# Patient Record
Sex: Male | Born: 1955 | Race: White | Hispanic: No | Marital: Married | Smoking: Former smoker
Health system: Southern US, Community
[De-identification: ages and names within clinical notes are randomized; demographics above are authoritative.]

## PROBLEM LIST (undated history)

## (undated) DIAGNOSIS — R569 Unspecified convulsions: Secondary | ICD-10-CM

## (undated) DIAGNOSIS — F0280 Dementia in other diseases classified elsewhere without behavioral disturbance: Secondary | ICD-10-CM

## (undated) DIAGNOSIS — R1310 Dysphagia, unspecified: Secondary | ICD-10-CM

## (undated) DIAGNOSIS — G4730 Sleep apnea, unspecified: Secondary | ICD-10-CM

## (undated) DIAGNOSIS — F02818 Dementia in other diseases classified elsewhere, unspecified severity, with other behavioral disturbance: Secondary | ICD-10-CM

## (undated) DIAGNOSIS — G309 Alzheimer's disease, unspecified: Secondary | ICD-10-CM

## (undated) DIAGNOSIS — R251 Tremor, unspecified: Secondary | ICD-10-CM

## (undated) DIAGNOSIS — F0281 Dementia in other diseases classified elsewhere with behavioral disturbance: Secondary | ICD-10-CM

## (undated) DIAGNOSIS — F419 Anxiety disorder, unspecified: Secondary | ICD-10-CM

## (undated) DIAGNOSIS — R296 Repeated falls: Secondary | ICD-10-CM

## (undated) DIAGNOSIS — R451 Restlessness and agitation: Secondary | ICD-10-CM

## (undated) DIAGNOSIS — E7800 Pure hypercholesterolemia, unspecified: Secondary | ICD-10-CM

## (undated) HISTORY — PX: OTHER SURGICAL HISTORY: SHX169

---

## 2013-07-06 ENCOUNTER — Ambulatory Visit: Payer: Self-pay | Admitting: Neurology

## 2015-03-22 ENCOUNTER — Emergency Department
Admission: EM | Admit: 2015-03-22 | Discharge: 2015-03-23 | Disposition: A | Discharge status: Home / Self Care | Payer: Medicare HMO | Attending: Emergency Medicine | Admitting: Emergency Medicine

## 2015-03-22 ENCOUNTER — Encounter: Payer: Self-pay | Admitting: Emergency Medicine

## 2015-03-22 DIAGNOSIS — Z7982 Long term (current) use of aspirin: Secondary | ICD-10-CM | POA: Insufficient documentation

## 2015-03-22 DIAGNOSIS — G309 Alzheimer's disease, unspecified: Secondary | ICD-10-CM | POA: Diagnosis not present

## 2015-03-22 DIAGNOSIS — F0390 Unspecified dementia without behavioral disturbance: Secondary | ICD-10-CM

## 2015-03-22 DIAGNOSIS — F0280 Dementia in other diseases classified elsewhere without behavioral disturbance: Secondary | ICD-10-CM | POA: Diagnosis not present

## 2015-03-22 DIAGNOSIS — Z880 Allergy status to penicillin: Secondary | ICD-10-CM | POA: Diagnosis not present

## 2015-03-22 DIAGNOSIS — Z87891 Personal history of nicotine dependence: Secondary | ICD-10-CM | POA: Insufficient documentation

## 2015-03-22 DIAGNOSIS — R451 Restlessness and agitation: Secondary | ICD-10-CM | POA: Diagnosis not present

## 2015-03-22 DIAGNOSIS — Z792 Long term (current) use of antibiotics: Secondary | ICD-10-CM | POA: Insufficient documentation

## 2015-03-22 DIAGNOSIS — G4733 Obstructive sleep apnea (adult) (pediatric): Secondary | ICD-10-CM | POA: Diagnosis not present

## 2015-03-22 DIAGNOSIS — Z79899 Other long term (current) drug therapy: Secondary | ICD-10-CM | POA: Diagnosis not present

## 2015-03-22 DIAGNOSIS — G478 Other sleep disorders: Secondary | ICD-10-CM | POA: Diagnosis present

## 2015-03-22 HISTORY — DX: Dysphagia, unspecified: R13.10

## 2015-03-22 HISTORY — DX: Restlessness and agitation: R45.1

## 2015-03-22 HISTORY — DX: Dementia in other diseases classified elsewhere, unspecified severity, without behavioral disturbance, psychotic disturbance, mood disturbance, and anxiety: F02.80

## 2015-03-22 HISTORY — DX: Dementia in other diseases classified elsewhere, unspecified severity, with other behavioral disturbance: F02.818

## 2015-03-22 HISTORY — DX: Tremor, unspecified: R25.1

## 2015-03-22 HISTORY — DX: Pure hypercholesterolemia, unspecified: E78.00

## 2015-03-22 HISTORY — DX: Anxiety disorder, unspecified: F41.9

## 2015-03-22 HISTORY — DX: Unspecified convulsions: R56.9

## 2015-03-22 HISTORY — DX: Sleep apnea, unspecified: G47.30

## 2015-03-22 HISTORY — DX: Repeated falls: R29.6

## 2015-03-22 HISTORY — DX: Alzheimer's disease, unspecified: G30.9

## 2015-03-22 HISTORY — DX: Dementia in other diseases classified elsewhere with behavioral disturbance: F02.81

## 2015-03-22 LAB — CBC WITH DIFFERENTIAL/PLATELET
BASOS ABS: 0.2 10*3/uL — AB (ref 0–0.1)
BASOS PCT: 2 %
EOS PCT: 3 %
Eosinophils Absolute: 0.3 10*3/uL (ref 0–0.7)
HCT: 42.8 % (ref 40.0–52.0)
Hemoglobin: 14.3 g/dL (ref 13.0–18.0)
Lymphocytes Relative: 28 %
Lymphs Abs: 2.3 10*3/uL (ref 1.0–3.6)
MCH: 29 pg (ref 26.0–34.0)
MCHC: 33.4 g/dL (ref 32.0–36.0)
MCV: 86.7 fL (ref 80.0–100.0)
MONO ABS: 0.5 10*3/uL (ref 0.2–1.0)
Monocytes Relative: 6 %
Neutro Abs: 5.2 10*3/uL (ref 1.4–6.5)
Neutrophils Relative %: 61 %
PLATELETS: 322 10*3/uL (ref 150–440)
RBC: 4.93 MIL/uL (ref 4.40–5.90)
RDW: 14.3 % (ref 11.5–14.5)
WBC: 8.5 10*3/uL (ref 3.8–10.6)

## 2015-03-22 LAB — COMPREHENSIVE METABOLIC PANEL
ALBUMIN: 3.8 g/dL (ref 3.5–5.0)
ALT: 15 U/L — ABNORMAL LOW (ref 17–63)
AST: 17 U/L (ref 15–41)
Alkaline Phosphatase: 87 U/L (ref 38–126)
Anion gap: 10 (ref 5–15)
BUN: 13 mg/dL (ref 6–20)
CHLORIDE: 106 mmol/L (ref 101–111)
CO2: 28 mmol/L (ref 22–32)
Calcium: 9.7 mg/dL (ref 8.9–10.3)
Creatinine, Ser: 0.86 mg/dL (ref 0.61–1.24)
GFR calc Af Amer: >60 mL/min (ref >60)
GFR calc non Af Amer: >60 mL/min (ref >60)
GLUCOSE: 88 mg/dL (ref 65–99)
POTASSIUM: 3.9 mmol/L (ref 3.5–5.1)
SODIUM: 144 mmol/L (ref 135–145)
TOTAL PROTEIN: 7.7 g/dL (ref 6.5–8.1)
Total Bilirubin: 0.6 mg/dL (ref 0.3–1.2)

## 2015-03-22 NOTE — ED Provider Notes (Signed)
[facility] Emergency Department Provider Note  ____________________________________________  Time seen: 2230  I have reviewed the triage vital signs and the nursing notes.  History by:  Patient's wife.  History Limited due to the patient's dementia and nonverbal status.  HISTORY  Chief Complaint Questionable seizure   sonorous breathing while sleeping  Decreased responsiveness compared to baseline.    HPI Joel Frost is a 60 y.o. male who has had early onset dementia. He stays at [facility] due to his need for additional care. He was recently moved there from [place].   This evening, the staff noted that he was having noisier breathing in his sleep and usual. His wife tells me that he does have a history of objective sleep apnea. He has never been able to tolerate a pressure mask for that condition.  With the noisy breathing, the staff attempted to awaken the patient. He did not respond in his usual manner. They report he appeared somewhat gray.EMS was called. While EMS was present and subsequently department facility with the patient, the nursing staff at [facility] reports that the patient had improved to baseline.   The staff at called the patient's wife. The wife met the patient here in the emergency department. She reports he was not acting his usual manner at first but he is now.  He had a similar episode approximately 2 months ago while staying in [place]. At that time, he was considered to be a possible seizure. No seizure activity was noted this evening.     Past Medical History  Diagnosis Date  . Alzheimer disease   . Dementia associated with other underlying disease with behavioral disturbance   . Dysphagia   . Repeated falls   . Tremor   . Anxiety   . Restlessness and agitation   . Hypercholesteremia   . Seizures ([place])     There are no active problems to display for this patient.   Past Surgical  History  Procedure Laterality Date  . Ruptured disc      Current Outpatient Rx  Name  Route  Sig  Dispense  Refill  . aspirin 81 MG chewable tablet   Oral   Chew 81 mg by mouth daily.         . clindamycin (CLEOCIN) 150 MG capsule   Oral   Take 450 mg by mouth 3 (three) times daily.         . memantine (NAMENDA XR) 14 MG CP24 24 hr capsule   Oral   Take 14 mg by mouth daily.         . multivitamin-iron-minerals-folic acid (CENTRUM) chewable tablet   Oral   Chew 1 tablet by mouth daily.         Marland Kitchen oxyCODONE-acetaminophen (PERCOCET/ROXICET) 5-325 MG tablet   Oral   Take 1 tablet by mouth every 4 (four) hours as needed for moderate pain.         Marland Kitchen QUEtiapine (SEROQUEL) 200 MG tablet   Oral   Take 200 mg by mouth 3 (three) times daily.         . rivastigmine (EXELON) 9.5 mg/24hr   Transdermal   Place 9.5 mg onto the skin daily.           Allergies Penicillins and Sulfa antibiotics  No family history on file.  Social History Social History  Substance Use Topics  . Smoking status: Former Research scientist (life sciences)  . Smokeless tobacco: None  . Alcohol Use: No  Review of Systems Review of systems not possible due to the patient's dementia and nonverbal status.   ____________________________________________   PHYSICAL EXAM:  VITAL SIGNS: ED Triage Vitals  Enc Vitals Group     BP 03/22/15 2216 119/94 mmHg     Pulse Rate 03/22/15 2216 96     Resp 03/22/15 2216 18     Temp 03/22/15 2216 97.9 F (36.6 C)     Temp Source 03/22/15 2216 Axillary     SpO2 03/22/15 2200 97 %     Weight 03/22/15 2216 223 lb (101.152 kg)     Height 03/22/15 2216 6' 1"  (1.854 m)     Head Cir --      Peak Flow --      Pain Score --      Pain Loc --      Pain Edu? --      Excl. in [place]? --     Constitutional: Alert, normal-appearing color. Slight agitation as the nurses are trying to draw blood when I first entered the room. Patient's wife reports he is acting in his usual  manner. ENT   Head: Normocephalic and atraumatic.   Nose: No congestion/rhinnorhea.       Mouth: No erythema, no swelling. The hard palate is narrow and deep.   Cardiovascular: Normal rate at 96, regular rhythm, no murmur noted Respiratory:  Normal respiratory effort, no tachypnea.    Breath sounds are clear and equal bilaterally.  Gastrointestinal: Soft, no distention. Nontender Musculoskeletal: No deformity noted. Nontender with normal range of motion in all extremities.  No noted edema. Neurologic:  Patient is alert. He is somewhat agitated. He moves all 4 extremities. He is nonverbal and has cognition limitations. No focal changes noted.  Skin:  Skin is warm, dry. No rash noted. Psychiatric: Agitated. Cognitive limitations. Nonverbal. ____________________________________________    LABS (pertinent positives/negatives)  Labs Reviewed  CBC WITH DIFFERENTIAL/PLATELET - Abnormal; Notable for the following:    Basophils Absolute 0.2 (*)    All other components within normal limits  COMPREHENSIVE METABOLIC PANEL - Abnormal; Notable for the following:    ALT 15 (*)    All other components within normal limits     ____________________________________________   EKG  ED ECG REPORT I, Hayleen Clinkscales W, the attending physician, personally viewed and interpreted this ECG.   Date: 03/22/2015  EKG Time: 2238  Rate: 94  Rhythm: sinus rhythm  Axis: Normal  Intervals: Normal  ST&T Change: None noted   ____________________________________________    RADIOLOGY    ____________________________________________   PROCEDURES    ____________________________________________   INITIAL IMPRESSION / ASSESSMENT AND PLAN / ED COURSE  Pertinent labs & imaging results that were available during my care of the patient were reviewed by me and considered in my medical decision making (see chart for details).  Unclear clinical situation in a 60 year old male with early dementia.  He is back to his baseline currently.   It does sound as though he may have had increased degree of obstructive sleep apnea tonight, with sonorous breathing, subsequent gray color, decreased responsiveness, but now has improved to baseline.  I do not see any acute changes to his oropharynx. I don't think he has any ongoing obstruction. He is does not have any stridor.  We will observe the patient emergency department, checked basic labs, and if labs are negative, will discharge him back to the care facility.  The wife is in agreement with this plan.  ----------------------------------------- 11:26 PM on  03/22/2015 -----------------------------------------  Blood count and basic metabolic panel are within normal limits. Patient is acting at his baseline. He does not have any hypoxia. Heart rate and blood pressure reasonable. There is no fever. We will discharge the patient to return to the [facility].  ____________________________________________   FINAL CLINICAL IMPRESSION(S) / ED DIAGNOSES  Final diagnoses:  Obstructive sleep apnea  Dementia, without behavioral disturbance      Ahmed Prima, MD 03/22/15 2327

## 2015-03-22 NOTE — Discharge Instructions (Signed)
It seems the episode tonight was likely related to obstructive sleep apnea. Further evaluation may be needed. Speak with your regular physician about this. If there are further worrisome events or episodes where she have other urgent concerns, return to the emergency department.  Sleep Apnea Sleep apnea is disorder that affects a person's sleep. A person with sleep apnea has abnormal pauses in their breathing when they sleep. It is hard for them to get a good sleep. This makes a person tired during the day. It also can lead to other physical problems. There are three types of sleep apnea. One type is when breathing stops for a short time because your airway is blocked (obstructive sleep apnea). Another type is when the brain sometimes fails to give the normal signal to breathe to the muscles that control your breathing (central sleep apnea). The third type is a combination of the other two types. HOME CARE  Do not sleep on your back. Try to sleep on your side.  Take all medicine as told by your doctor.  Avoid alcohol, calming medicines (sedatives), and depressant drugs.  Try to lose weight if you are overweight. Talk to your doctor about a healthy weight goal. Your doctor may have you use a device that helps to open your airway. It can help you get the air that you need. It is called a positive airway pressure (PAP) device. There are three types of PAP devices:  Continuous positive airway pressure (CPAP) device.  Nasal expiratory positive airway pressure (EPAP) device.  Bilevel positive airway pressure (BPAP) device. MAKE SURE YOU:  Understand these instructions.  Will watch your condition.  Will get help right away if you are not doing well or get worse.   This information is not intended to replace advice given to you by your health care provider. Make sure you discuss any questions you have with your health care provider.   Document Released: 11/29/2007 Document Revised: 03/12/2014  Document Reviewed: 06/23/2011 Elsevier Interactive Patient Education [employer]

## 2015-03-22 NOTE — ED Notes (Signed)
Pt presents to ED via [facility] from [facility], EMS was called out for "snoring respirations." EMS states facility reported pt normally "sleeps silent and does not snore" EMS reports pt was gray in color upon their arrival and pt was unresponsive to stimuli. EMS states when they were leaving facility and staff nurse came out, nurse responded "he looks like his normal self now." Pt was awake. Per wife, pt had a similar episode x 2 months ago at [facility] ans was diagnosed with seizure. Pt alert, verbally responding at baseline.

## 2015-03-23 NOTE — ED Notes (Signed)

## 2015-03-23 NOTE — ED Notes (Signed)
Spoke with Tia, RN from [facility], discharge instructions given to facility nurse.

## 2015-08-22 ENCOUNTER — Emergency Department: Payer: Medicare HMO

## 2015-08-22 ENCOUNTER — Encounter: Payer: Self-pay | Admitting: Emergency Medicine

## 2015-08-22 ENCOUNTER — Other Ambulatory Visit: Payer: Self-pay

## 2015-08-22 ENCOUNTER — Emergency Department
Admission: EM | Admit: 2015-08-22 | Discharge: 2015-08-22 | Disposition: A | Discharge status: Home / Self Care | Payer: Medicare HMO | Attending: Emergency Medicine | Admitting: Emergency Medicine

## 2015-08-22 DIAGNOSIS — F0280 Dementia in other diseases classified elsewhere without behavioral disturbance: Secondary | ICD-10-CM

## 2015-08-22 DIAGNOSIS — F0281 Dementia in other diseases classified elsewhere with behavioral disturbance: Secondary | ICD-10-CM | POA: Insufficient documentation

## 2015-08-22 DIAGNOSIS — G40909 Epilepsy, unspecified, not intractable, without status epilepticus: Secondary | ICD-10-CM | POA: Diagnosis not present

## 2015-08-22 DIAGNOSIS — Z87891 Personal history of nicotine dependence: Secondary | ICD-10-CM | POA: Insufficient documentation

## 2015-08-22 DIAGNOSIS — Z79899 Other long term (current) drug therapy: Secondary | ICD-10-CM | POA: Diagnosis not present

## 2015-08-22 DIAGNOSIS — G309 Alzheimer's disease, unspecified: Secondary | ICD-10-CM | POA: Diagnosis not present

## 2015-08-22 DIAGNOSIS — Z7982 Long term (current) use of aspirin: Secondary | ICD-10-CM | POA: Diagnosis not present

## 2015-08-22 DIAGNOSIS — R569 Unspecified convulsions: Secondary | ICD-10-CM | POA: Diagnosis present

## 2015-08-22 LAB — CBC WITH DIFFERENTIAL/PLATELET
BASOS ABS: 0.1 10*3/uL (ref 0–0.1)
BASOS PCT: 2 %
Eosinophils Absolute: 0.2 10*3/uL (ref 0–0.7)
Eosinophils Relative: 2 %
HEMATOCRIT: 43 % (ref 40.0–52.0)
HEMOGLOBIN: 14.7 g/dL (ref 13.0–18.0)
Lymphocytes Relative: 20 %
Lymphs Abs: 1.7 10*3/uL (ref 1.0–3.6)
MCH: 29.6 pg (ref 26.0–34.0)
MCHC: 34.2 g/dL (ref 32.0–36.0)
MCV: 86.6 fL (ref 80.0–100.0)
MONOS PCT: 7 %
Monocytes Absolute: 0.6 10*3/uL (ref 0.2–1.0)
NEUTROS ABS: 5.7 10*3/uL (ref 1.4–6.5)
NEUTROS PCT: 69 %
Platelets: 325 10*3/uL (ref 150–440)
RBC: 4.97 MIL/uL (ref 4.40–5.90)
RDW: 14 % (ref 11.5–14.5)
WBC: 8.3 10*3/uL (ref 3.8–10.6)

## 2015-08-22 LAB — URINALYSIS COMPLETE WITH MICROSCOPIC (ARMC ONLY)
BACTERIA UA: NONE SEEN
Bilirubin Urine: NEGATIVE
Glucose, UA: NEGATIVE mg/dL
HGB URINE DIPSTICK: NEGATIVE
LEUKOCYTES UA: NEGATIVE
NITRITE: NEGATIVE
PH: 6 (ref 5.0–8.0)
PROTEIN: 100 mg/dL — AB
SPECIFIC GRAVITY, URINE: 1.017 (ref 1.005–1.030)

## 2015-08-22 LAB — BASIC METABOLIC PANEL
ANION GAP: 9 (ref 5–15)
BUN: 15 mg/dL (ref 6–20)
CALCIUM: 9.6 mg/dL (ref 8.9–10.3)
CO2: 27 mmol/L (ref 22–32)
Chloride: 106 mmol/L (ref 101–111)
Creatinine, Ser: 0.87 mg/dL (ref 0.61–1.24)
GFR calc non Af Amer: >60 mL/min (ref >60)
Glucose, Bld: 86 mg/dL (ref 65–99)
Potassium: 4.4 mmol/L (ref 3.5–5.1)
Sodium: 142 mmol/L (ref 135–145)

## 2015-08-22 MED ORDER — SODIUM CHLORIDE 0.9 % IV BOLUS (SEPSIS)
1000.0000 mL | Freq: Once | INTRAVENOUS | Status: AC
  Administered 2015-08-22: 1000 mL via INTRAVENOUS

## 2015-08-22 NOTE — ED Notes (Signed)
[facility] April contacted with report, states unable to provide transport. Will arrange EMS transport.

## 2015-08-22 NOTE — ED Provider Notes (Signed)
[facility] Emergency Department Provider Note  ____________________________________________  Time seen: 9:10 AM  I have reviewed the triage vital signs and the nursing notes.   HISTORY  Chief Complaint Seizures Seizure Level 5 caveat:  Portions of the history and physical were unable to be obtained due to the patient's altered mental status  HPI Joel Frost is a 60 y.o. male sent [facility] the [facility] from [facility] due to a seizure today. He has a history of seizures as well as Alzheimer's disease, dementia, being nonverbal at baseline. This morning he was in his usual state of health when he became unresponsive and had generalized shaking activity for about 10 minutes. This resolved spontaneously. No other acute symptoms reported. Patient code status is DO NOT RESUSCITATE.     Past Medical History  Diagnosis Date  . Alzheimer disease   . Dementia associated with other underlying disease with behavioral disturbance   . Dysphagia   . Repeated falls   . Tremor   . Anxiety   . Restlessness and agitation   . Hypercholesteremia   . Seizures ([facility])   . Sleep apnea      There are no active problems to display for this patient.    Past Surgical History  Procedure Laterality Date  . Ruptured disc       Current Outpatient Rx  Name  Route  Sig  Dispense  Refill  . aspirin 81 MG chewable tablet   Oral   Chew 81 mg by mouth daily.         . memantine (NAMENDA XR) 14 MG CP24 24 hr capsule   Oral   Take 14 mg by mouth daily.         . multivitamin-iron-minerals-folic acid (CENTRUM) chewable tablet   Oral   Chew 1 tablet by mouth daily.         Marland Kitchen. oxyCODONE-acetaminophen (PERCOCET/ROXICET) 5-325 MG tablet   Oral   Take 1 tablet by mouth every 4 (four) hours as needed for moderate pain.         Marland Kitchen. QUEtiapine (SEROQUEL) 200 MG tablet   Oral   Take 200 mg by mouth 3 (three) times daily.         . rivastigmine (EXELON) 9.5 mg/24hr  Transdermal   Place 9.5 mg onto the skin daily.            Allergies Penicillins; Sulfa antibiotics; and Terramycin   No family history on file.  Social History Social History  Substance Use Topics  . Smoking status: Former Games developermoker  . Smokeless tobacco: None  . Alcohol Use: No    Review of Systems Unable to obtain ____________________________________________   PHYSICAL EXAM:  VITAL SIGNS: ED Triage Vitals  Enc Vitals Group     BP --      Pulse --      Resp --      Temp --      Temp src --      SpO2 --      Weight --      Height --      Head Cir --      Peak Flow --      Pain Score --      Pain Loc --      Pain Edu? --      Excl. in [place]? --     Vital signs reviewed, nursing assessments reviewed.   Constitutional:  Awake, not interactive, not in apparent distress. Eyes:  No scleral icterus. No conjunctival pallor. PERRL. EOMI.  No nystagmus. ENT   Head:   Normocephalic and atraumatic.   Nose:   No congestion/rhinnorhea. No septal hematoma   Mouth/Throat:   Dry mucous membranes, no pharyngeal erythema. No peritonsillar mass.    Neck:   No stridor. No SubQ emphysema. No meningismus. Hematological/Lymphatic/Immunilogical:   No cervical lymphadenopathy. Cardiovascular:   RRR. Symmetric bilateral radial and DP pulses.  No murmurs.  Respiratory:   Normal respiratory effort without tachypnea nor retractions. Breath sounds are clear and equal bilaterally. No wheezes/rales/rhonchi. Gastrointestinal:   Soft and nontender. Non distended. There is no CVA tenderness.  No rebound, rigidity, or guarding. Genitourinary:   deferred Musculoskeletal:   Nontender with normal range of motion in all extremities. No joint effusions.  No lower extremity tenderness.  No edema. Neurologic:   Nonverbal.  Neurologic examination limited by patient's inability to participate in exam. No gross focal neurologic deficits are appreciated.  Skin:    Skin is warm, dry and  intact. No rash noted.  No petechiae, purpura, or bullae.  ____________________________________________    LABS (pertinent positives/negatives) (all labs ordered are listed, but only abnormal results are displayed) Labs Reviewed  URINALYSIS COMPLETEWITH MICROSCOPIC ([facility] ONLY) - Abnormal; Notable for the following:    Color, Urine YELLOW (*)    APPearance CLEAR (*)    Ketones, ur TRACE (*)    Protein, ur 100 (*)    Squamous Epithelial / LPF 0-5 (*)    All other components within normal limits  BASIC METABOLIC PANEL  CBC WITH DIFFERENTIAL/PLATELET  CBG MONITORING, ED   ____________________________________________   EKG  Interpreted by me Normal sinus rhythm rate of 90, normal axis and intervals. Normal QRS ST segments and T waves.  ____________________________________________    RADIOLOGY  CT head unremarkable Chest x-ray unremarkable  ____________________________________________   PROCEDURES   ____________________________________________   INITIAL IMPRESSION / ASSESSMENT AND PLAN / ED COURSE  Pertinent labs & imaging results that were available during my care of the patient were reviewed by me and considered in my medical decision making (see chart for details).  Patient with seizure disorder but also underlying dementia and nonverbal state presents with seizure and diminished interactiveness. Due to limitations of history and physical, we'll proceed with labs chest x-ray urinalysis CT head.  ----------------------------------------- 10:16 AM on 08/22/2015 -----------------------------------------  Vital signs normal, workup negative. Plan to discharge home, follow-up with primary care and neurology follow-up. The patient remains awake and stable in the ED.       ____________________________________________   FINAL CLINICAL IMPRESSION(S) / ED DIAGNOSES  Final diagnoses:  Seizure ([facility])  Alzheimer's dementia       Portions of this note were  generated with dragon dictation software. Dictation errors may occur despite best attempts at proofreading.   Sharman Cheek, MD 08/22/15 [phone]

## 2015-08-22 NOTE — ED Notes (Signed)
Possible seizure at nursing home described as eyes rolling back and shaking approx 10 min. Arrives awake, nonverbal per baseline per report. Noted to [facility].

## 2015-08-22 NOTE — ED Notes (Signed)
Patient discharged by ambulance to [facility]. Wife will meet him there.

## 2015-08-22 NOTE — Discharge Instructions (Signed)
Epilepsy Epilepsy is a disorder in which a person has repeated seizures over time. A seizure is a release of abnormal electrical activity in the brain. Seizures can cause a change in attention, behavior, or the ability to remain awake and alert (altered mental status). Seizures often involve uncontrollable shaking (convulsions).  Most people with epilepsy lead normal lives. However, people with epilepsy are at an increased risk of falls, accidents, and injuries. Therefore, it is important to begin treatment right away. CAUSES  Epilepsy has many possible causes. Anything that disturbs the normal pattern of brain cell activity can lead to seizures. This may include:   Head injury.  Birth trauma.  High fever as a child.  Stroke.  Bleeding into or around the brain.  Certain drugs.  Prolonged low oxygen, such as what occurs after CPR efforts.  Abnormal brain development.  Certain illnesses, such as meningitis, encephalitis (brain infection), malaria, and other infections.  An imbalance of nerve signaling chemicals (neurotransmitters).  SIGNS AND SYMPTOMS  The symptoms of a seizure can vary greatly from one person to another. Right before a seizure, you may have a warning (aura) that a seizure is about to occur. An aura may include the following symptoms:  Fear or anxiety.  Nausea.  Feeling like the room is spinning (vertigo).  Vision changes, such as seeing flashing lights or spots. Common symptoms during a seizure include:  Abnormal sensations, such as an abnormal smell or a bitter taste in the mouth.   Sudden, general body stiffness.   Convulsions that involve rhythmic jerking of the face, arm, or leg on one or both sides.   Sudden change in consciousness.   Appearing to be awake but not responding.   Appearing to be asleep but cannot be awakened.   Grimacing, chewing, lip smacking, drooling, tongue biting, or loss of bowel or bladder control. After a seizure,  you may feel sleepy for a while. DIAGNOSIS  Your health care provider will ask about your symptoms and take a medical history. Descriptions from any witnesses to your seizures will be very helpful in the diagnosis. A physical exam, including a detailed neurological exam, is necessary. Various tests may be done, such as:   An electroencephalogram (EEG). This is a painless test of your brain waves. In this test, a diagram is created of your brain waves. These diagrams can be interpreted by a specialist.  An MRI of the brain.   A CT scan of the brain.   A spinal tap (lumbar puncture, LP).  Blood tests to check for signs of infection or abnormal blood chemistry. TREATMENT  There is no cure for epilepsy, but it is generally treatable. Once epilepsy is diagnosed, it is important to begin treatment as soon as possible. For most people with epilepsy, seizures can be controlled with medicines. The following may also be used:  A pacemaker for the brain (vagus nerve stimulator) can be used for people with seizures that are not well controlled by medicine.  Surgery on the brain. For some people, epilepsy eventually goes away. HOME CARE INSTRUCTIONS   Follow your health care provider's recommendations on driving and safety in normal activities.  Get enough rest. Lack of sleep can cause seizures.  Only take over-the-counter or prescription medicines as directed by your health care provider. Take any prescribed medicine exactly as directed.  Avoid any known triggers of your seizures.  Keep a seizure diary. Record what you recall about any seizure, especially any possible trigger.   Make  sure the people you live and work with know that you are prone to seizures. They should receive instructions on how to help you. In general, a witness to a seizure should:   Cushion your head and body.   Turn you on your side.   Avoid unnecessarily restraining you.   Not place anything inside your  mouth.   Call for emergency medical help if there is any question about what has occurred.   Follow up with your health care provider as directed. You may need regular blood tests to monitor the levels of your medicine.  SEEK MEDICAL CARE IF:   You develop signs of infection or other illness. This might increase the risk of a seizure.   You seem to be having more frequent seizures.   Your seizure pattern is changing.  SEEK IMMEDIATE MEDICAL CARE IF:   You have a seizure that does not stop after a few moments.   You have a seizure that causes any difficulty in breathing.   You have a seizure that results in a very severe headache.   You have a seizure that leaves you with the inability to speak or use a part of your body.    This information is not intended to replace advice given to you by your health care provider. Make sure you discuss any questions you have with your health care provider.   Document Released: 02/19/2005 Document Revised: 12/10/2012 Document Reviewed: 10/01/2012 Elsevier Interactive Patient Education 2016 [place] Disease Alzheimer disease is a mental disorder. It causes memory loss and loss of other mental functions, such as learning, thinking, problem solving, communicating, and completing tasks. The mental losses interfere with the ability to perform daily activities at work, at home, or in social situations. Alzheimer disease usually starts in a person's late 36s or early 51s but can start earlier in life (familial form). The mental changes caused by this disease are permanent and worsen over time. As the illness progresses, the ability to do even the simplest things is lost. Survival with Alzheimer disease ranges from several years to as long as 20 years. CAUSES Alzheimer disease is caused by abnormally high levels of a protein (beta-amyloid) in the brain. This protein forms very small deposits within and around the brain's nerve  cells. These deposits prevent the nerve cells from working properly. Experts are not certain what causes the beta-amyloid deposits in this disease. RISK FACTORS The following major risk factors have been identified:  Increasing age.  Certain genetic variations, such as Down syndrome (trisomy 21). SYMPTOMS In the early stages of Alzheimer disease, you are still able to perform daily activities but need greater effort, more time, or memory aids. Early symptoms include:  Mild memory loss of recent events, names, or phone numbers.  Loss of objects.  Minor loss of vocabulary.  Difficulty with complex tasks, such as paying bills or driving in unfamiliar locations. Other mental functions deteriorate as the disease worsens. These changes slowly go from mild to severe. Symptoms at this stage include:  Difficulty remembering. You may not be able to recall personal information such as your address and telephone number. You may become confused about the date, the season of the year, or your location.  Difficulty maintaining attention. You may forget what you wanted to say during conversations and repeat what you have already said.  Difficulty learning new information or tasks. You may not remember what you read or the name of a new friend you  met.  Difficulty counting or doing math. You may have difficulty with complex math problems. You may make mistakes in paying bills or managing your checkbook.  Poor reasoning and judgment. You may make poor decisions or not dress right for the weather.  Difficulty communicating. You may have regular difficulty remembering words, naming objects, expressing yourself clearly, or writing sentences that make sense.  Difficulty performing familiar daily activities. You may get lost driving in familiar locations or need help eating, bathing, dressing, grooming, or using the toilet. You may have difficulty maintaining bladder or bowel control.  Difficulty  recognizing familiar faces. You may confuse family members or close friends with one another. You may not recognize a close relative or may mistake strangers for family. Alzheimer disease also may cause changes in personality and behavior. These changes include:   Loss of interest or motivation.  Social withdrawal.  Anxiety.  Difficulty sleeping.  Uncharacteristic anger or combativeness.  A false belief that someone is trying to harm you (paranoia).  Seeing things that are not real (hallucinations).  Agitation. Confusion and disruptive behavior are often worse at night and may be triggered by changes in the environment or acute medical issues. DIAGNOSIS  Alzheimer disease is diagnosed through an assessment by your health care provider. During this assessment, your health care provider will do the following:  Ask you and your family, friends, or caregivers questions about your symptoms, their frequency, their duration and progression, and the effect they are having on your life.  Ask questions about your personal and family medical history and use of alcohol or drugs, including prescription medicine.  Perform a physical exam and order blood tests and brain imaging exams. Your health care provider may refer you to a specialist for detailed evaluation of your mental functions (neuropsychological testing).  Many different brain disorders, medical conditions, and certain substances can cause symptoms that resemble Alzheimer disease symptoms. These must be ruled out before this disease can be diagnosed. If Alzheimer disease is diagnosed, it will be considered either "possible" or "probable" Alzheimer disease. "Possible" Alzheimer disease means that your symptoms are typical of the disease and no other disorder is causing them. "Probable" Alzheimer disease means that you also have a family history of the disease or genetic test results that support the diagnosis. Certain tests, mostly used in  research studies, are highly specific for Alzheimer disease.  TREATMENT  There is currently no cure for this disease. The goals of treatment are to:  Slow down the progression of the disease.  Preserve mental function as long as possible.  Manage behavioral symptoms.  Make life easier for the person with Alzheimer disease and his or her caregivers. The following treatment options are available:  Medicine. Certain medicines may help slow memory loss by changing the level of certain chemicals in the brain. Medicine may also help with behavioral symptoms.  Talk therapy. Talk therapy provides education, support, and memory aids for people with this disease. It is most effective in the early stages of the illness.  Caregiving. Caregivers may be family members, friends, or trained medical professionals. They help the person with Alzheimer disease with daily life activities. Caregiving may take place at home or at a nursing facility.  Family support groups. These provide education, emotional support, and information about community resources to family members who are taking care of the person with this disease.   This information is not intended to replace advice given to you by your health care provider. Make sure  you discuss any questions you have with your health care provider.   Document Released: 11/01/2003 Document Revised: 03/12/2014 Document Reviewed: 06/27/2012 Elsevier Interactive Patient Education [employer].

## 2016-02-23 ENCOUNTER — Emergency Department: Payer: Medicare HMO

## 2016-02-23 ENCOUNTER — Emergency Department
Admission: EM | Admit: 2016-02-23 | Discharge: 2016-02-23 | Disposition: A | Discharge status: Home / Self Care | Payer: Medicare HMO | Attending: Emergency Medicine | Admitting: Emergency Medicine

## 2016-02-23 ENCOUNTER — Encounter: Payer: Self-pay | Admitting: Emergency Medicine

## 2016-02-23 DIAGNOSIS — R569 Unspecified convulsions: Secondary | ICD-10-CM

## 2016-02-23 DIAGNOSIS — G40909 Epilepsy, unspecified, not intractable, without status epilepticus: Secondary | ICD-10-CM | POA: Insufficient documentation

## 2016-02-23 DIAGNOSIS — Z7982 Long term (current) use of aspirin: Secondary | ICD-10-CM | POA: Insufficient documentation

## 2016-02-23 DIAGNOSIS — G309 Alzheimer's disease, unspecified: Secondary | ICD-10-CM | POA: Insufficient documentation

## 2016-02-23 DIAGNOSIS — Z79899 Other long term (current) drug therapy: Secondary | ICD-10-CM | POA: Insufficient documentation

## 2016-02-23 DIAGNOSIS — Z87891 Personal history of nicotine dependence: Secondary | ICD-10-CM | POA: Insufficient documentation

## 2016-02-23 LAB — CBC
HCT: 43.1 % (ref 40.0–52.0)
Hemoglobin: 14.5 g/dL (ref 13.0–18.0)
MCH: 29.5 pg (ref 26.0–34.0)
MCHC: 33.6 g/dL (ref 32.0–36.0)
MCV: 87.7 fL (ref 80.0–100.0)
Platelets: 358 10*3/uL (ref 150–440)
RBC: 4.91 MIL/uL (ref 4.40–5.90)
RDW: 14.3 % (ref 11.5–14.5)
WBC: 12.7 10*3/uL — AB (ref 3.8–10.6)

## 2016-02-23 LAB — URINALYSIS, COMPLETE (UACMP) WITH MICROSCOPIC
Bacteria, UA: NONE SEEN
Bilirubin Urine: NEGATIVE
GLUCOSE, UA: 150 mg/dL — AB
HGB URINE DIPSTICK: NEGATIVE
KETONES UR: 5 mg/dL — AB
Leukocytes, UA: NEGATIVE
Nitrite: NEGATIVE
PROTEIN: 30 mg/dL — AB
Specific Gravity, Urine: 1.023 (ref 1.005–1.030)
Squamous Epithelial / LPF: NONE SEEN
pH: 5 (ref 5.0–8.0)

## 2016-02-23 LAB — COMPREHENSIVE METABOLIC PANEL
ALBUMIN: 3.8 g/dL (ref 3.5–5.0)
ALT: 16 U/L — ABNORMAL LOW (ref 17–63)
AST: 33 U/L (ref 15–41)
Alkaline Phosphatase: 77 U/L (ref 38–126)
Anion gap: 10 (ref 5–15)
BUN: 12 mg/dL (ref 6–20)
CHLORIDE: 106 mmol/L (ref 101–111)
CO2: 23 mmol/L (ref 22–32)
Calcium: 9.2 mg/dL (ref 8.9–10.3)
Creatinine, Ser: 1.07 mg/dL (ref 0.61–1.24)
GFR calc Af Amer: >60 mL/min (ref >60)
Glucose, Bld: 144 mg/dL — ABNORMAL HIGH (ref 65–99)
POTASSIUM: 3.8 mmol/L (ref 3.5–5.1)
SODIUM: 139 mmol/L (ref 135–145)
Total Bilirubin: <0.1 mg/dL — ABNORMAL LOW (ref 0.3–1.2)
Total Protein: 7.5 g/dL (ref 6.5–8.1)

## 2016-02-23 NOTE — ED Provider Notes (Signed)
[facility] Emergency Department Provider Note  ____________________________________________   First MD Initiated Contact with Patient 02/23/16 1516     (approximate)  I have reviewed the triage vital signs and the nursing notes.   HISTORY  Chief Complaint Seizures   HPI Joel Frost is a 60 y.o. male with a history of dementia and a seizure disorder on Keppra who is presenting after seizure. His wife witnessed seizure which she said lasted about 10 minutes. The patient also had a low oxygen level while being transported to the emergency department was placed on supplemental oxygen. There is a concern for aspiration. The wife is at the bedside and said the patient only speaks intermittently and that he is acting his baseline at this time.   Past Medical History:  Diagnosis Date  . Alzheimer disease   . Anxiety   . Dementia associated with other underlying disease with behavioral disturbance   . Dysphagia   . Hypercholesteremia   . Repeated falls   . Restlessness and agitation   . Seizures (HCC)   . Sleep apnea   . Tremor     There are no active problems to display for this patient.   Past Surgical History:  Procedure Laterality Date  . ruptured disc      Prior to Admission medications   Medication Sig Start Date End Date Taking? Authorizing Provider  aspirin 81 MG chewable tablet Chew 81 mg by mouth daily.    Historical Provider, MD  memantine (NAMENDA XR) 14 MG CP24 24 hr capsule Take 14 mg by mouth daily.    Historical Provider, MD  multivitamin-iron-minerals-folic acid (CENTRUM) chewable tablet Chew 1 tablet by mouth daily.    Historical Provider, MD  oxyCODONE-acetaminophen (PERCOCET/ROXICET) 5-325 MG tablet Take 1 tablet by mouth every 4 (four) hours as needed for moderate pain.    Historical Provider, MD  QUEtiapine (SEROQUEL) 200 MG tablet Take 200 mg by mouth 3 (three) times daily.    Historical Provider, MD  rivastigmine (EXELON)  9.5 mg/24hr Place 9.5 mg onto the skin daily.    Historical Provider, MD    Allergies Penicillins; Sulfa antibiotics; and Terramycin [oxytetracycline]  No family history on file.  Social History Social History  Substance Use Topics  . Smoking status: Former Games developermoker  . Smokeless tobacco: Not on file  . Alcohol use No    Review of Systems Level V caveat secondary to dementia.  ____________________________________________   PHYSICAL EXAM:  VITAL SIGNS: ED Triage Vitals [02/23/16 1422]  Enc Vitals Group     BP 121/81     Pulse Rate (!) 101     Resp 16     Temp      Temp Source Oral     SpO2 95 %     Weight      Height      Head Circumference      Peak Flow      Pain Score      Pain Loc      Pain Edu?      Excl. in GC?     Constitutional: Alert and oriented. in no acute distress. Eyes: Conjunctivae are normal. PERRL. EOMI. Head: Atraumatic. Nose: No congestion/rhinnorhea. Mouth/Throat: Mucous membranes are moist.   Cardiovascular: Normal rate, regular rhythm. Grossly normal heart sounds.  Good peripheral circulation. Respiratory: Normal respiratory effort.  No retractions. Lungs CTAB. Gastrointestinal: Soft and nontender. No distention.  Musculoskeletal: No lower extremity tenderness nor edema.  No joint effusions.  Neurologic:  Normal speech and language. No gross focal neurologic deficits are appreciated. Moves all 4 extremities equally. Skin:  Skin is warm, dry and intact. No rash noted.   ____________________________________________   LABS (all labs ordered are listed, but only abnormal results are displayed)  Labs Reviewed  CBC - Abnormal; Notable for the following:       Result Value   WBC 12.7 (*)    All other components within normal limits  COMPREHENSIVE METABOLIC PANEL - Abnormal; Notable for the following:    Glucose, Bld 144 (*)    ALT 16 (*)    Total Bilirubin <0.1 (*)    All other components within normal limits  URINALYSIS, COMPLETE  (UACMP) WITH MICROSCOPIC - Abnormal; Notable for the following:    Color, Urine YELLOW (*)    APPearance CLEAR (*)    Glucose, UA 150 (*)    Ketones, ur 5 (*)    Protein, ur 30 (*)    All other components within normal limits   ____________________________________________  EKG  ED ECG REPORT I, Arelia Longest, the attending physician, personally viewed and interpreted this ECG.   Date: 02/23/2016  EKG Time: 1433  Rate: 103  Rhythm: sinus tachycardia  Axis: Rightward axis  Intervals:none  ST&T Change: No ST segment elevation or depression. No abnormal T-wave inversion.  ____________________________________________  RADIOLOGY  CT Head Wo Contrast (Final result)  Result time 02/23/16 16:44:40  Final result by Berdine Dance, MD (02/23/16 16:44:40)           Narrative:   CLINICAL DATA: Witnessed seizure activity, history of dementia  EXAM: CT HEAD WITHOUT CONTRAST  TECHNIQUE: Contiguous axial images were obtained from the base of the skull through the vertex without intravenous contrast.  COMPARISON: 08/22/2015  FINDINGS: Brain: Advanced brain atrophy and chronic white matter microvascular ischemic changes. Marked ventricular enlargement, unchanged, suspect related to the brain atrophy but difficult to exclude normal pressure hydrocephalus. No acute intracranial hemorrhage, new mass lesion, definite infarction, mass effect, focal edema, midline shift, herniation, or extra-axial fluid collection. Cisterns are patent. Cerebellar atrophy as well.  Vascular: No hyperdense vessel or unexpected calcification.  Skull: Normal. Negative for fracture or focal lesion.  Sinuses/Orbits: No acute finding.  Other: None.  IMPRESSION: Advanced brain atrophy and chronic white matter microvascular changes.  No acute intracranial abnormality or interval change by noncontrast CT  Stable ventricular enlargement, as above.   Electronically Signed By: Judie Petit. Shick  M.D. On: 02/23/2016 16:44            DG Chest 1 View (Final result)  Result time 02/23/16 16:03:24  Final result by Alcide Clever, MD (02/23/16 16:03:24)           Narrative:   CLINICAL DATA: Recent seizure activity  EXAM: CHEST 1 VIEW  COMPARISON: 08/22/2015  FINDINGS: Cardiac shadow is stable. Mild basilar scarring is again seen and stable. No acute infiltrate is seen. No bony abnormality is noted.  IMPRESSION: No acute abnormality noted.   Electronically Signed By: Alcide Clever M.D. On: 02/23/2016 16:03            ____________________________________________   PROCEDURES  Procedure(s) performed:   Procedures  Critical Care performed:   ____________________________________________   INITIAL IMPRESSION / ASSESSMENT AND PLAN / ED COURSE  Pertinent labs & imaging results that were available during my care of the patient were reviewed by me and considered in my medical decision making (see chart for details).   Clinical Course   -----------------------------------------  5:42 PM on 02/23/2016 -----------------------------------------  Patient without any distress at this time. No obvious focus of infection. Heart rate of 100 bpm upon reevaluation. Acting at his baseline per son and wife are at the bedside. Likely breakthrough seizure. Patient to follow up with his neurologist, Dr. Malvin JohnsPotter. A splint to the family and they are understanding willing to comply. Will be discharged home.    ____________________________________________   FINAL CLINICAL IMPRESSION(S) / ED DIAGNOSES  Seizure.    NEW MEDICATIONS STARTED DURING THIS VISIT:  New Prescriptions   No medications on file     Note:  This document was prepared using Dragon voice recognition software and may include unintentional dictation errors.    Myrna Blazeravid Matthew Shia Delaine, MD 02/23/16 [phone]

## 2016-02-23 NOTE — ED Notes (Signed)
This RN spoke with Aggie Cosierheresa, Charity fundraiserN at [facility], who states they have sent med list by fax. She also states that pt takes kepra 500mg  2x/day . MD notified

## 2016-02-23 NOTE — ED Triage Notes (Addendum)
Pt to ED via EMS from [facility], per EMS wife witnessed pt have grand mal seizure, lasted aprox 10min. Pt hx of seizures and dementia. Per EMS pt was 88% on RA upon arrival, pt placed on nonrebreather sats are 95%. Per EMS facility noted possible aspiration. Family is on way per EMS

## 2016-02-23 NOTE — ED Notes (Signed)
RN talked to Campbell Soupiffany from [facility], given fax number. Will send over pt's med list.

## 2016-02-23 NOTE — ED Notes (Signed)
ED Provider at bedside. 

## 2016-02-23 NOTE — ED Notes (Signed)
Pt placed on 2L [place], o2 sat 95-98%

## 2017-04-05 DEATH — deceased

## 2017-05-22 IMAGING — CT CT HEAD W/O CM
3 series · 16 of 47 positions shown, 19 images · non-contrast
Comparison: 08/22/2015

CLINICAL DATA: Witnessed seizure activity, history of dementia

EXAM:
CT HEAD WITHOUT CONTRAST
TECHNIQUE: Contiguous axial images were obtained from the base of the skull
through the vertex without intravenous contrast.

[Series 2: head wo · axial · 0.43mm/px · z∈[+1363,+1488]mm · 10 of 31 slices shown, 13 images]
[im 3/31  brain]
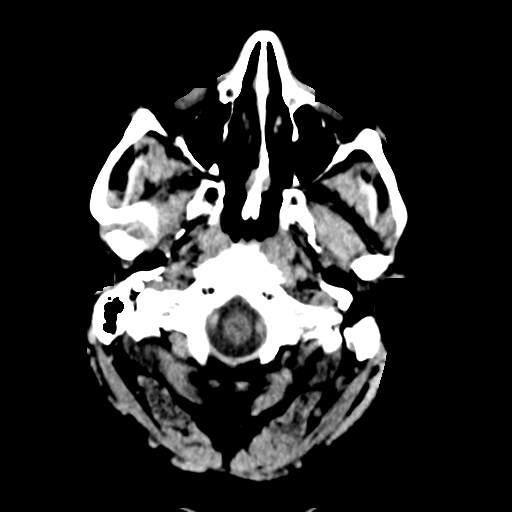
[im 3/31  bone]
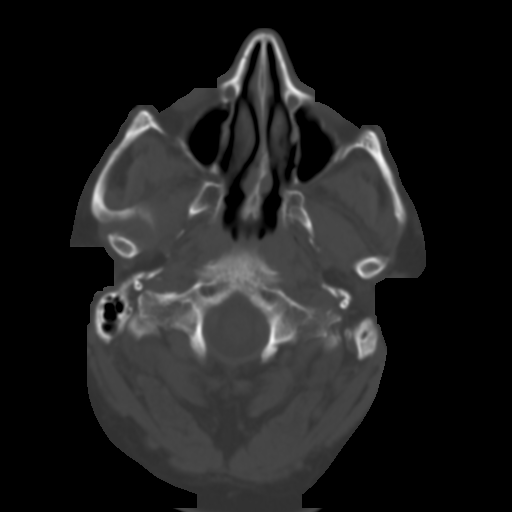
[im 6/31  brain]
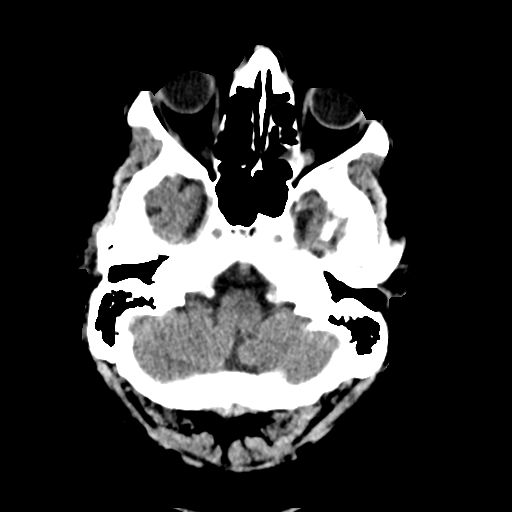
[im 9/31  brain]
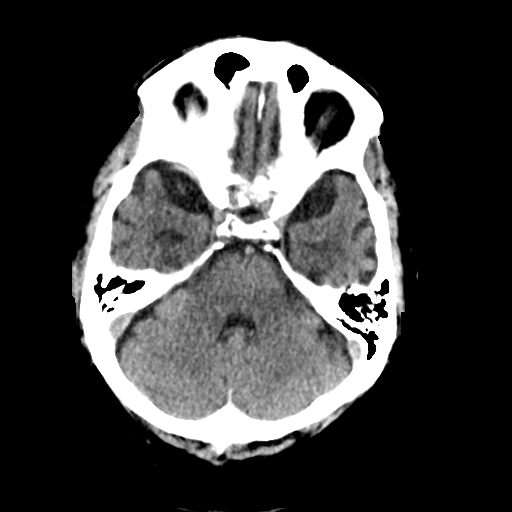
[im 11/31  brain]
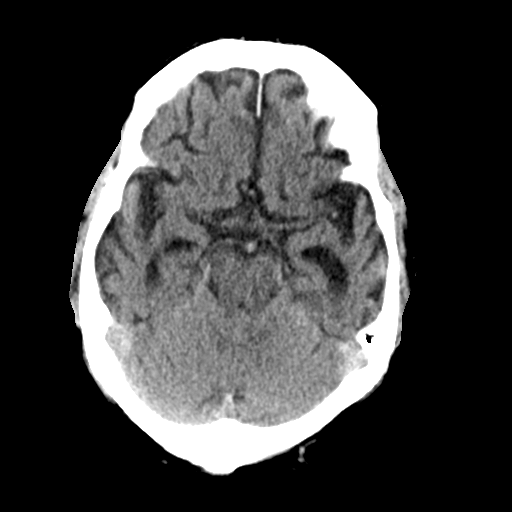
[im 14/31  brain]
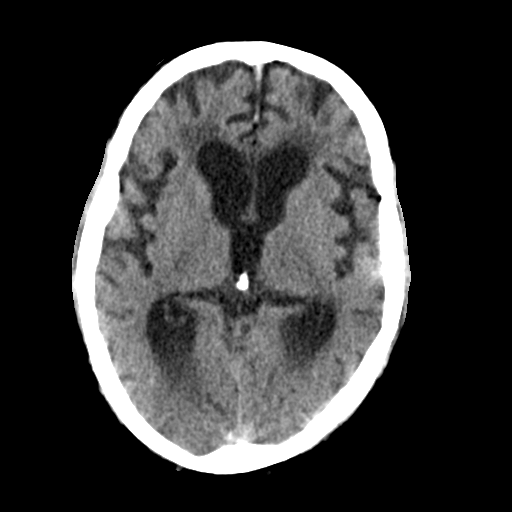
[im 14/31  bone]
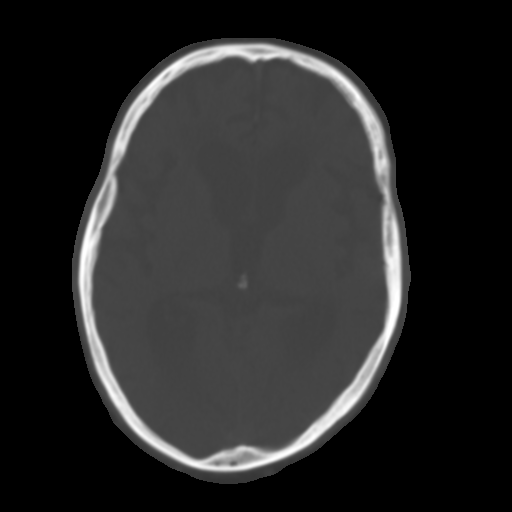
[im 17/31  brain]
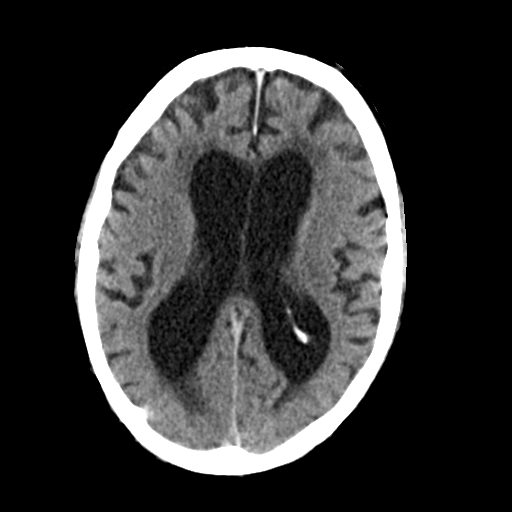
[im 20/31  brain]
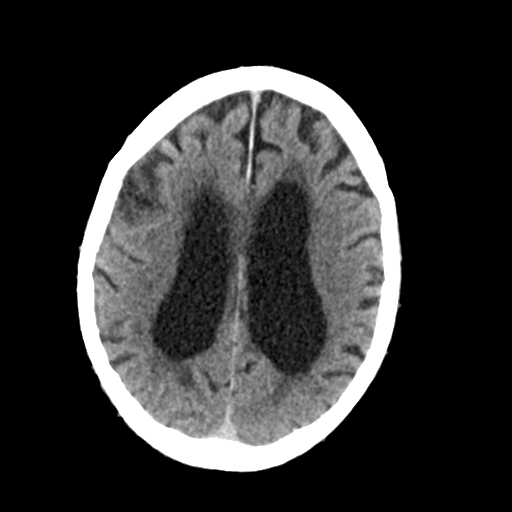
[im 23/31  brain]
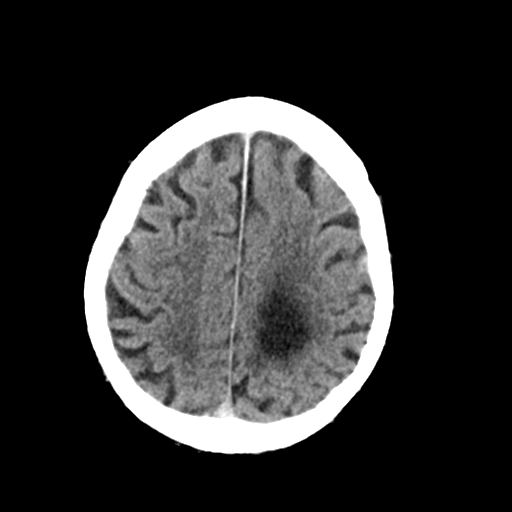
[im 25/31  brain]
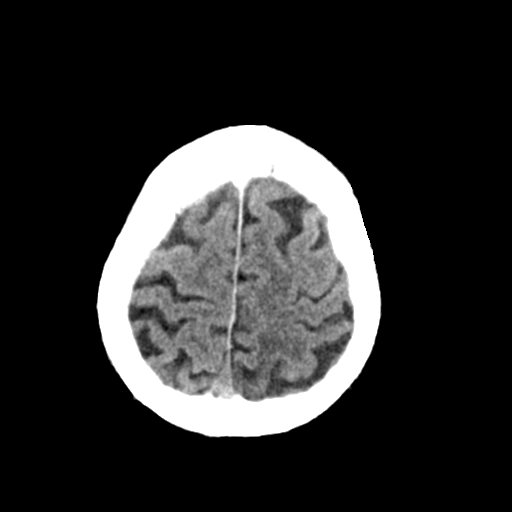
[im 25/31  bone]
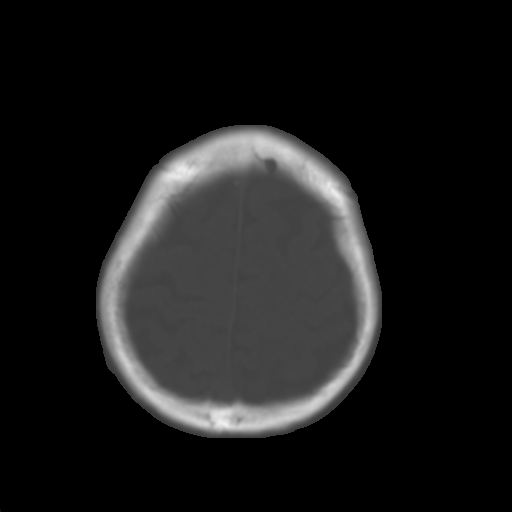
[im 28/31  brain]
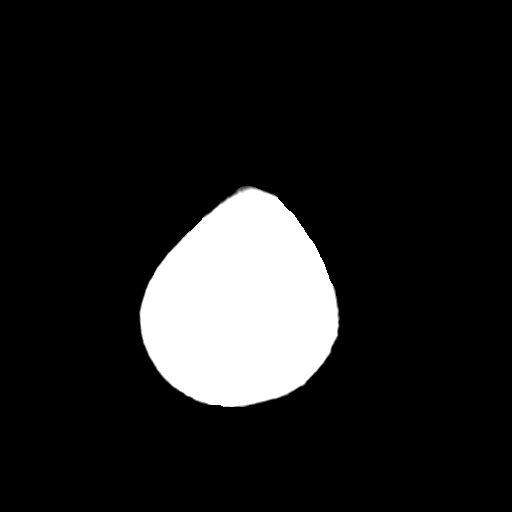

[Series 4: coronal soft tissue · coronal · 0.32mm/px · 3 of 66 slices shown]
[im 22/66  brain]
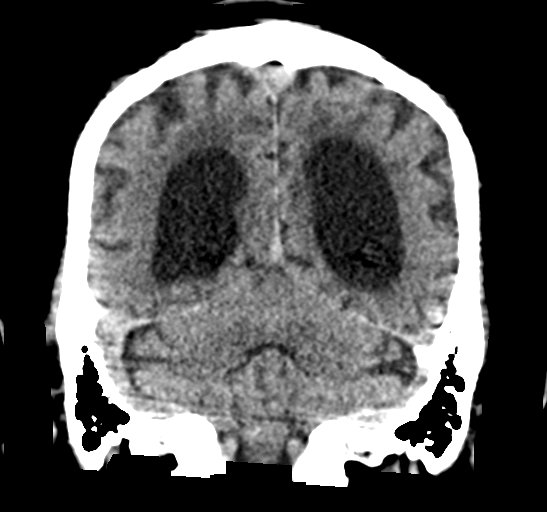
[im 29/66  brain]
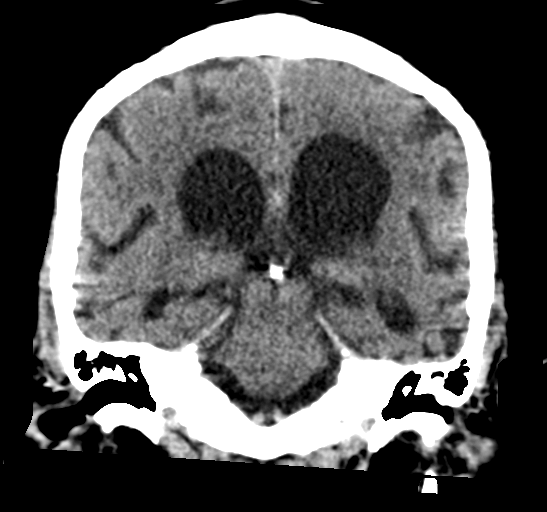
[im 37/66  brain]
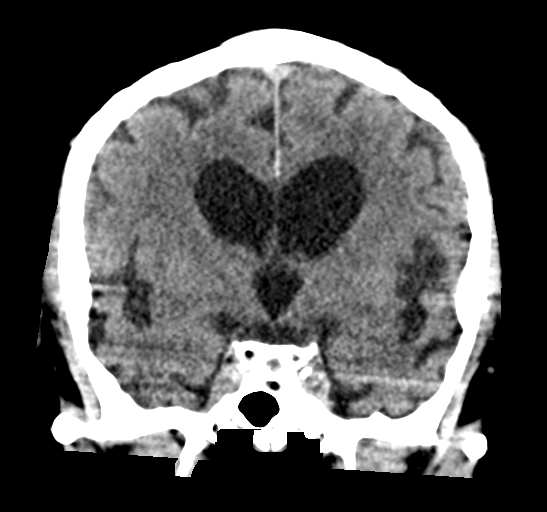

[Series 5: sagittal soft tissue · sagittal · 0.32mm/px · 3 of 54 slices shown]
[im 18/54  brain]
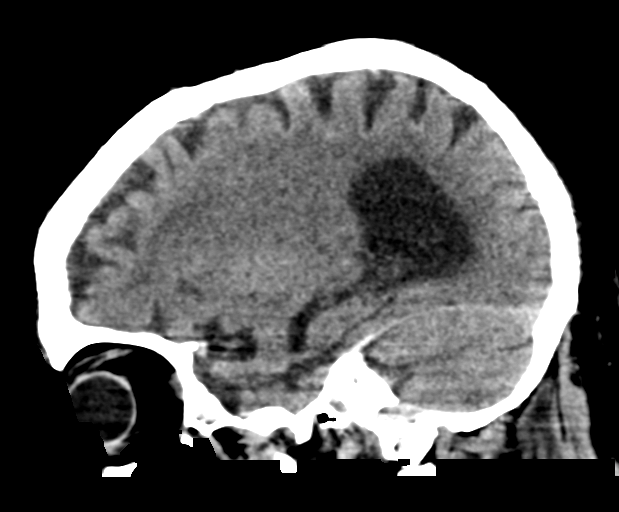
[im 27/54  brain]
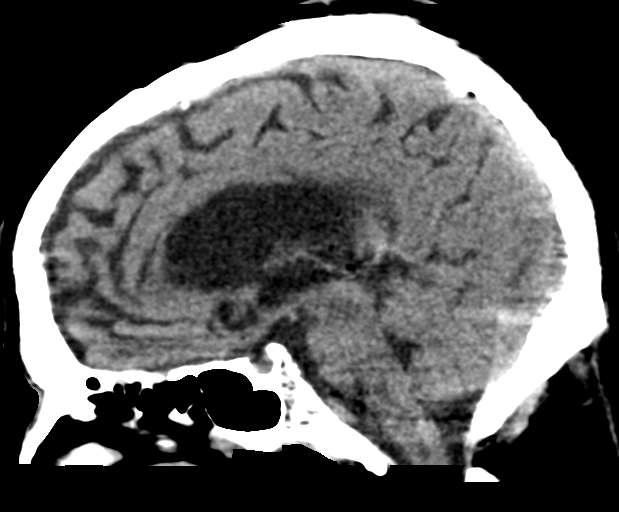
[im 36/54  brain]
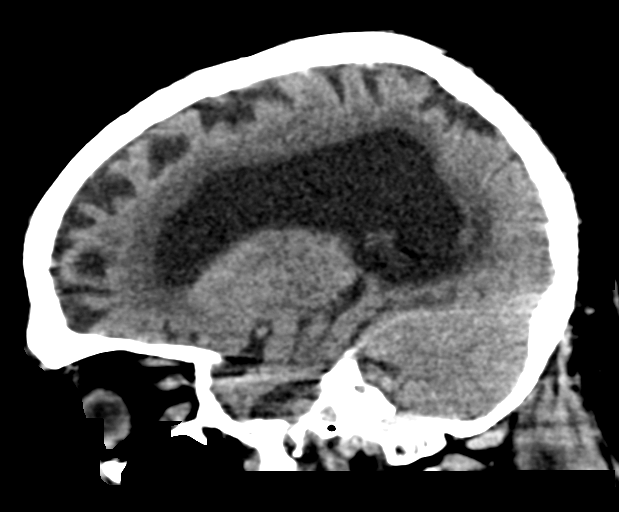

[16 of 47 positions shown; findings below may reference images not displayed]

FINDINGS: Brain: Advanced brain atrophy and chronic white matter microvascular
ischemic changes. Marked ventricular enlargement, unchanged, suspect
related to the brain atrophy but difficult to exclude normal
pressure hydrocephalus. No acute intracranial hemorrhage, new mass
lesion, definite infarction, mass effect, focal edema, midline
shift, herniation, or extra-axial fluid collection. Cisterns are
patent. Cerebellar atrophy as well.

Vascular: No hyperdense vessel or unexpected calcification.

Skull: Normal. Negative for fracture or focal lesion.

Sinuses/Orbits: No acute finding.

Other: None.
IMPRESSION: Advanced brain atrophy and chronic white matter microvascular
changes.

No acute intracranial abnormality or interval change by noncontrast
CT

Stable ventricular enlargement, as above.
